# Patient Record
Sex: Female | Born: 1965 | Race: White | Hispanic: No | Marital: Married | Smoking: Never smoker
Health system: Southern US, Community
[De-identification: ages and names within clinical notes are randomized; demographics above are authoritative.]

## PROBLEM LIST (undated history)

## (undated) DIAGNOSIS — J302 Other seasonal allergic rhinitis: Secondary | ICD-10-CM

## (undated) DIAGNOSIS — K219 Gastro-esophageal reflux disease without esophagitis: Secondary | ICD-10-CM

## (undated) DIAGNOSIS — J45909 Unspecified asthma, uncomplicated: Secondary | ICD-10-CM

## (undated) DIAGNOSIS — E079 Disorder of thyroid, unspecified: Secondary | ICD-10-CM

## (undated) DIAGNOSIS — M810 Age-related osteoporosis without current pathological fracture: Secondary | ICD-10-CM

## (undated) HISTORY — DX: Unspecified asthma, uncomplicated: J45.909

## (undated) HISTORY — DX: Gastro-esophageal reflux disease without esophagitis: K21.9

## (undated) HISTORY — DX: Disorder of thyroid, unspecified: E07.9

## (undated) HISTORY — DX: Age-related osteoporosis without current pathological fracture: M81.0

## (undated) HISTORY — DX: Other seasonal allergic rhinitis: J30.2

---
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
## 1984-03-30 HISTORY — PX: WISDOM TOOTH EXTRACTION: SHX21

## 2001-08-16 ENCOUNTER — Other Ambulatory Visit: Admission: RE | Admit: 2001-08-16 | Discharge: 2001-08-16 | Payer: Self-pay | Admitting: *Deleted

## 2003-06-12 ENCOUNTER — Other Ambulatory Visit: Admission: RE | Admit: 2003-06-12 | Discharge: 2003-06-12 | Payer: Self-pay | Admitting: Obstetrics and Gynecology

## 2005-05-27 ENCOUNTER — Encounter: Admission: RE | Admit: 2005-05-27 | Discharge: 2005-05-27 | Payer: Self-pay | Admitting: Gastroenterology

## 2005-11-18 ENCOUNTER — Encounter: Admission: RE | Admit: 2005-11-18 | Discharge: 2005-11-18 | Payer: Self-pay | Admitting: Obstetrics and Gynecology

## 2007-01-26 ENCOUNTER — Encounter: Admission: RE | Admit: 2007-01-26 | Discharge: 2007-01-26 | Payer: Self-pay | Admitting: Obstetrics

## 2008-07-24 ENCOUNTER — Encounter: Admission: RE | Admit: 2008-07-24 | Discharge: 2008-07-24 | Payer: Self-pay | Admitting: Obstetrics

## 2010-04-20 ENCOUNTER — Encounter: Payer: Self-pay | Admitting: Obstetrics and Gynecology

## 2010-04-20 ENCOUNTER — Encounter: Payer: Self-pay | Admitting: Otolaryngology

## 2010-09-04 ENCOUNTER — Other Ambulatory Visit: Payer: Self-pay | Admitting: Obstetrics

## 2010-09-04 DIAGNOSIS — Z1231 Encounter for screening mammogram for malignant neoplasm of breast: Secondary | ICD-10-CM

## 2010-09-12 ENCOUNTER — Ambulatory Visit
Admission: RE | Admit: 2010-09-12 | Discharge: 2010-09-12 | Disposition: A | Discharge status: Home / Self Care | Payer: BC Managed Care – PPO | Source: Ambulatory Visit | Attending: Obstetrics | Admitting: Obstetrics

## 2010-09-12 DIAGNOSIS — Z1231 Encounter for screening mammogram for malignant neoplasm of breast: Secondary | ICD-10-CM

## 2012-10-19 ENCOUNTER — Other Ambulatory Visit: Payer: Self-pay

## 2012-10-19 DIAGNOSIS — Z1231 Encounter for screening mammogram for malignant neoplasm of breast: Secondary | ICD-10-CM

## 2012-11-09 ENCOUNTER — Ambulatory Visit
Admission: RE | Admit: 2012-11-09 | Discharge: 2012-11-09 | Disposition: A | Discharge status: Home / Self Care | Payer: BC Managed Care – PPO | Source: Ambulatory Visit

## 2012-11-09 DIAGNOSIS — Z1231 Encounter for screening mammogram for malignant neoplasm of breast: Secondary | ICD-10-CM

## 2012-11-15 DIAGNOSIS — IMO0002 Reserved for concepts with insufficient information to code with codable children: Secondary | ICD-10-CM | POA: Insufficient documentation

## 2012-11-15 DIAGNOSIS — Y9289 Other specified places as the place of occurrence of the external cause: Secondary | ICD-10-CM | POA: Insufficient documentation

## 2012-11-15 DIAGNOSIS — Y9389 Activity, other specified: Secondary | ICD-10-CM | POA: Insufficient documentation

## 2012-11-15 DIAGNOSIS — T169XXA Foreign body in ear, unspecified ear, initial encounter: Secondary | ICD-10-CM | POA: Insufficient documentation

## 2012-11-16 ENCOUNTER — Encounter (HOSPITAL_COMMUNITY): Payer: Self-pay | Admitting: Emergency Medicine

## 2012-11-16 ENCOUNTER — Emergency Department (HOSPITAL_COMMUNITY)
Admission: EM | Admit: 2012-11-16 | Discharge: 2012-11-16 | Disposition: A | Discharge status: Home / Self Care | Payer: BC Managed Care – PPO | Attending: Emergency Medicine | Admitting: Emergency Medicine

## 2012-11-16 DIAGNOSIS — S00451A Superficial foreign body of right ear, initial encounter: Secondary | ICD-10-CM

## 2012-11-16 NOTE — ED Notes (Signed)
PT. REPORTS INSECT INSIDE RIGHT EAR THIS EVENING , DENIES PAIN .

## 2012-11-16 NOTE — ED Provider Notes (Signed)
  CSN: [id number]     Arrival date & time 11/15/12  2347 History     None    Chief Complaint  Patient presents with  . Foreign Body in Ear   (Consider location/radiation/quality/duration/timing/severity/associated sxs/prior Treatment) HPI History provided by pt.   Pt was taking food outside to her cats last night, felt a buzzing in her R ear, and it felt as though a bug went into her ear canal.  Had mild discomfort and felt intermittent vibrations.  ried using a flashlight to lure the bug to the light.  She never saw the bug fly out.  Denies pain currently.  Denies hearing impairment as well.   History reviewed. No pertinent past medical history. History reviewed. No pertinent past surgical history. No family history on file. History  Substance Use Topics  . Smoking status: Never Smoker   . Smokeless tobacco: Not on file  . Alcohol Use: No   OB History   Grav Para Term Preterm Abortions TAB SAB Ect Mult Living                 Review of Systems  All other systems reviewed and are negative.    Allergies  Peanut-containing drug products; Codeine; Penicillins; Sulfa antibiotics; and Zithromax  Home Medications  No current outpatient prescriptions on file. BP 123/74  Pulse 65  Temp(Src) 97.4 F (36.3 C) (Oral)  Resp 16  SpO2 100% Physical Exam  Nursing note and vitals reviewed. Constitutional: She is oriented to person, place, and time. She appears well-developed and well-nourished. No distress.  HENT:  Head: Normocephalic and atraumatic.  R external ear and TM nml.  No foreign body, edema, bleeding or drainage in EAC.    Eyes:  Normal appearance  Neck: Normal range of motion.  Pulmonary/Chest: Effort normal.  Musculoskeletal: Normal range of motion.  Neurological: She is alert and oriented to person, place, and time.  Psychiatric: She has a normal mood and affect. Her behavior is normal.    ED Course   Procedures (including critical care time)  Labs Reviewed -  No data to display No results found. 1. Foreign body in ear lobe, right, initial encounter     MDM  Pt believes that there is an insect in her right ear.  No foreign body and R ear otherwise unremarkable on exam.  Pt reassured and d/c'd home.   Otilio Miu, PA-C 11/16/12 0301

## 2012-11-17 NOTE — ED Provider Notes (Signed)
Medical screening examination/treatment/procedure(s) were performed by non-physician practitioner and as supervising physician I was immediately available for consultation/collaboration.    Kalijah Westfall B. Draper Gallon, MD 11/17/12 1134 

## 2016-10-21 DIAGNOSIS — Z1329 Encounter for screening for other suspected endocrine disorder: Secondary | ICD-10-CM | POA: Diagnosis not present

## 2016-10-21 DIAGNOSIS — Z1231 Encounter for screening mammogram for malignant neoplasm of breast: Secondary | ICD-10-CM | POA: Diagnosis not present

## 2016-10-21 DIAGNOSIS — Z681 Body mass index (BMI) 19 or less, adult: Secondary | ICD-10-CM | POA: Diagnosis not present

## 2016-10-21 DIAGNOSIS — Z131 Encounter for screening for diabetes mellitus: Secondary | ICD-10-CM | POA: Diagnosis not present

## 2016-10-21 DIAGNOSIS — Z13220 Encounter for screening for lipoid disorders: Secondary | ICD-10-CM | POA: Diagnosis not present

## 2016-10-21 DIAGNOSIS — Z130 Encounter for screening for diseases of the blood and blood-forming organs and certain disorders involving the immune mechanism: Secondary | ICD-10-CM | POA: Diagnosis not present

## 2016-10-21 DIAGNOSIS — Z01419 Encounter for gynecological examination (general) (routine) without abnormal findings: Secondary | ICD-10-CM | POA: Diagnosis not present

## 2016-10-21 DIAGNOSIS — Z0000 Encounter for general adult medical examination without abnormal findings: Secondary | ICD-10-CM | POA: Diagnosis not present

## 2016-12-22 DIAGNOSIS — E039 Hypothyroidism, unspecified: Secondary | ICD-10-CM | POA: Diagnosis not present

## 2017-02-10 DIAGNOSIS — E039 Hypothyroidism, unspecified: Secondary | ICD-10-CM | POA: Diagnosis not present

## 2017-03-16 DIAGNOSIS — H2513 Age-related nuclear cataract, bilateral: Secondary | ICD-10-CM | POA: Diagnosis not present

## 2017-05-30 DIAGNOSIS — J309 Allergic rhinitis, unspecified: Secondary | ICD-10-CM | POA: Diagnosis not present

## 2017-05-30 DIAGNOSIS — J069 Acute upper respiratory infection, unspecified: Secondary | ICD-10-CM | POA: Diagnosis not present

## 2017-06-07 DIAGNOSIS — J0100 Acute maxillary sinusitis, unspecified: Secondary | ICD-10-CM | POA: Diagnosis not present

## 2017-06-07 DIAGNOSIS — R0789 Other chest pain: Secondary | ICD-10-CM | POA: Diagnosis not present

## 2017-08-05 DIAGNOSIS — E039 Hypothyroidism, unspecified: Secondary | ICD-10-CM | POA: Diagnosis not present

## 2017-09-20 DIAGNOSIS — L718 Other rosacea: Secondary | ICD-10-CM | POA: Diagnosis not present

## 2017-09-20 DIAGNOSIS — L821 Other seborrheic keratosis: Secondary | ICD-10-CM | POA: Diagnosis not present

## 2017-09-20 DIAGNOSIS — D0472 Carcinoma in situ of skin of left lower limb, including hip: Secondary | ICD-10-CM | POA: Diagnosis not present

## 2017-09-20 DIAGNOSIS — D1801 Hemangioma of skin and subcutaneous tissue: Secondary | ICD-10-CM | POA: Diagnosis not present

## 2017-09-20 DIAGNOSIS — D0461 Carcinoma in situ of skin of right upper limb, including shoulder: Secondary | ICD-10-CM | POA: Diagnosis not present

## 2017-10-06 DIAGNOSIS — D0461 Carcinoma in situ of skin of right upper limb, including shoulder: Secondary | ICD-10-CM | POA: Diagnosis not present

## 2017-11-03 DIAGNOSIS — E039 Hypothyroidism, unspecified: Secondary | ICD-10-CM | POA: Diagnosis not present

## 2017-11-09 DIAGNOSIS — E039 Hypothyroidism, unspecified: Secondary | ICD-10-CM | POA: Diagnosis not present

## 2017-12-09 DIAGNOSIS — Z1231 Encounter for screening mammogram for malignant neoplasm of breast: Secondary | ICD-10-CM | POA: Diagnosis not present

## 2017-12-09 DIAGNOSIS — Z01419 Encounter for gynecological examination (general) (routine) without abnormal findings: Secondary | ICD-10-CM | POA: Diagnosis not present

## 2017-12-09 DIAGNOSIS — N952 Postmenopausal atrophic vaginitis: Secondary | ICD-10-CM | POA: Diagnosis not present

## 2017-12-09 DIAGNOSIS — Z1151 Encounter for screening for human papillomavirus (HPV): Secondary | ICD-10-CM | POA: Diagnosis not present

## 2017-12-09 DIAGNOSIS — Z681 Body mass index (BMI) 19 or less, adult: Secondary | ICD-10-CM | POA: Diagnosis not present

## 2018-01-11 DIAGNOSIS — E039 Hypothyroidism, unspecified: Secondary | ICD-10-CM | POA: Diagnosis not present

## 2018-03-10 DIAGNOSIS — Z1211 Encounter for screening for malignant neoplasm of colon: Secondary | ICD-10-CM | POA: Diagnosis not present

## 2018-03-10 DIAGNOSIS — Z1212 Encounter for screening for malignant neoplasm of rectum: Secondary | ICD-10-CM | POA: Diagnosis not present

## 2018-06-14 DIAGNOSIS — E039 Hypothyroidism, unspecified: Secondary | ICD-10-CM | POA: Diagnosis not present

## 2018-11-01 DIAGNOSIS — E039 Hypothyroidism, unspecified: Secondary | ICD-10-CM | POA: Diagnosis not present

## 2018-11-15 DIAGNOSIS — E039 Hypothyroidism, unspecified: Secondary | ICD-10-CM | POA: Diagnosis not present

## 2019-04-24 DIAGNOSIS — H43812 Vitreous degeneration, left eye: Secondary | ICD-10-CM | POA: Diagnosis not present

## 2019-04-24 DIAGNOSIS — H2513 Age-related nuclear cataract, bilateral: Secondary | ICD-10-CM | POA: Diagnosis not present

## 2019-04-24 DIAGNOSIS — G43109 Migraine with aura, not intractable, without status migrainosus: Secondary | ICD-10-CM | POA: Diagnosis not present

## 2019-06-07 DIAGNOSIS — H43812 Vitreous degeneration, left eye: Secondary | ICD-10-CM | POA: Diagnosis not present

## 2019-06-07 DIAGNOSIS — G43109 Migraine with aura, not intractable, without status migrainosus: Secondary | ICD-10-CM | POA: Diagnosis not present

## 2019-06-07 DIAGNOSIS — H2513 Age-related nuclear cataract, bilateral: Secondary | ICD-10-CM | POA: Diagnosis not present

## 2019-07-20 DIAGNOSIS — Z01419 Encounter for gynecological examination (general) (routine) without abnormal findings: Secondary | ICD-10-CM | POA: Diagnosis not present

## 2019-07-20 DIAGNOSIS — R7989 Other specified abnormal findings of blood chemistry: Secondary | ICD-10-CM | POA: Diagnosis not present

## 2019-07-20 DIAGNOSIS — Z13220 Encounter for screening for lipoid disorders: Secondary | ICD-10-CM | POA: Diagnosis not present

## 2019-07-20 DIAGNOSIS — Z0000 Encounter for general adult medical examination without abnormal findings: Secondary | ICD-10-CM | POA: Diagnosis not present

## 2019-07-20 DIAGNOSIS — Z131 Encounter for screening for diabetes mellitus: Secondary | ICD-10-CM | POA: Diagnosis not present

## 2019-07-20 DIAGNOSIS — Z1231 Encounter for screening mammogram for malignant neoplasm of breast: Secondary | ICD-10-CM | POA: Diagnosis not present

## 2019-07-20 DIAGNOSIS — Z130 Encounter for screening for diseases of the blood and blood-forming organs and certain disorders involving the immune mechanism: Secondary | ICD-10-CM | POA: Diagnosis not present

## 2019-07-20 DIAGNOSIS — Z6822 Body mass index (BMI) 22.0-22.9, adult: Secondary | ICD-10-CM | POA: Diagnosis not present

## 2019-11-14 DIAGNOSIS — E039 Hypothyroidism, unspecified: Secondary | ICD-10-CM | POA: Diagnosis not present

## 2019-11-21 DIAGNOSIS — E039 Hypothyroidism, unspecified: Secondary | ICD-10-CM | POA: Diagnosis not present

## 2020-09-26 DIAGNOSIS — M9903 Segmental and somatic dysfunction of lumbar region: Secondary | ICD-10-CM | POA: Diagnosis not present

## 2020-09-26 DIAGNOSIS — M5116 Intervertebral disc disorders with radiculopathy, lumbar region: Secondary | ICD-10-CM | POA: Diagnosis not present

## 2020-09-26 DIAGNOSIS — M25551 Pain in right hip: Secondary | ICD-10-CM | POA: Diagnosis not present

## 2020-09-26 DIAGNOSIS — M9905 Segmental and somatic dysfunction of pelvic region: Secondary | ICD-10-CM | POA: Diagnosis not present

## 2020-10-28 DIAGNOSIS — E039 Hypothyroidism, unspecified: Secondary | ICD-10-CM | POA: Diagnosis not present

## 2020-11-01 DIAGNOSIS — E039 Hypothyroidism, unspecified: Secondary | ICD-10-CM | POA: Diagnosis not present

## 2021-02-26 DIAGNOSIS — E039 Hypothyroidism, unspecified: Secondary | ICD-10-CM | POA: Diagnosis not present

## 2021-03-26 DIAGNOSIS — K900 Celiac disease: Secondary | ICD-10-CM | POA: Diagnosis not present

## 2021-03-26 DIAGNOSIS — E039 Hypothyroidism, unspecified: Secondary | ICD-10-CM | POA: Diagnosis not present

## 2021-03-26 DIAGNOSIS — E063 Autoimmune thyroiditis: Secondary | ICD-10-CM | POA: Diagnosis not present

## 2021-03-26 DIAGNOSIS — R413 Other amnesia: Secondary | ICD-10-CM | POA: Diagnosis not present

## 2021-03-30 HISTORY — PX: MELANOMA EXCISION: SHX5266

## 2021-04-03 DIAGNOSIS — D0371 Melanoma in situ of right lower limb, including hip: Secondary | ICD-10-CM | POA: Diagnosis not present

## 2021-04-03 DIAGNOSIS — D225 Melanocytic nevi of trunk: Secondary | ICD-10-CM | POA: Diagnosis not present

## 2021-04-03 DIAGNOSIS — L820 Inflamed seborrheic keratosis: Secondary | ICD-10-CM | POA: Diagnosis not present

## 2021-04-03 DIAGNOSIS — L821 Other seborrheic keratosis: Secondary | ICD-10-CM | POA: Diagnosis not present

## 2021-04-03 DIAGNOSIS — D485 Neoplasm of uncertain behavior of skin: Secondary | ICD-10-CM | POA: Diagnosis not present

## 2021-04-24 DIAGNOSIS — L988 Other specified disorders of the skin and subcutaneous tissue: Secondary | ICD-10-CM | POA: Diagnosis not present

## 2021-04-24 DIAGNOSIS — D0371 Melanoma in situ of right lower limb, including hip: Secondary | ICD-10-CM | POA: Diagnosis not present

## 2021-06-04 DIAGNOSIS — R413 Other amnesia: Secondary | ICD-10-CM | POA: Diagnosis not present

## 2021-06-04 DIAGNOSIS — E039 Hypothyroidism, unspecified: Secondary | ICD-10-CM | POA: Diagnosis not present

## 2021-06-04 DIAGNOSIS — K900 Celiac disease: Secondary | ICD-10-CM | POA: Diagnosis not present

## 2021-06-17 DIAGNOSIS — K9041 Non-celiac gluten sensitivity: Secondary | ICD-10-CM | POA: Diagnosis not present

## 2021-06-17 DIAGNOSIS — L900 Lichen sclerosus et atrophicus: Secondary | ICD-10-CM | POA: Diagnosis not present

## 2021-06-17 DIAGNOSIS — Z1231 Encounter for screening mammogram for malignant neoplasm of breast: Secondary | ICD-10-CM | POA: Diagnosis not present

## 2021-06-17 DIAGNOSIS — Z113 Encounter for screening for infections with a predominantly sexual mode of transmission: Secondary | ICD-10-CM | POA: Diagnosis not present

## 2021-06-17 DIAGNOSIS — Z0000 Encounter for general adult medical examination without abnormal findings: Secondary | ICD-10-CM | POA: Diagnosis not present

## 2021-06-17 DIAGNOSIS — Z01419 Encounter for gynecological examination (general) (routine) without abnormal findings: Secondary | ICD-10-CM | POA: Diagnosis not present

## 2021-06-17 DIAGNOSIS — Z13220 Encounter for screening for lipoid disorders: Secondary | ICD-10-CM | POA: Diagnosis not present

## 2021-06-17 DIAGNOSIS — Z124 Encounter for screening for malignant neoplasm of cervix: Secondary | ICD-10-CM | POA: Diagnosis not present

## 2021-06-17 DIAGNOSIS — Z681 Body mass index (BMI) 19 or less, adult: Secondary | ICD-10-CM | POA: Diagnosis not present

## 2021-06-17 DIAGNOSIS — Z131 Encounter for screening for diabetes mellitus: Secondary | ICD-10-CM | POA: Diagnosis not present

## 2021-06-18 ENCOUNTER — Other Ambulatory Visit: Payer: Self-pay | Admitting: Obstetrics

## 2021-06-18 DIAGNOSIS — Z13820 Encounter for screening for osteoporosis: Secondary | ICD-10-CM

## 2021-09-26 DIAGNOSIS — M5451 Vertebrogenic low back pain: Secondary | ICD-10-CM | POA: Diagnosis not present

## 2021-10-08 DIAGNOSIS — D225 Melanocytic nevi of trunk: Secondary | ICD-10-CM | POA: Diagnosis not present

## 2021-10-08 DIAGNOSIS — L819 Disorder of pigmentation, unspecified: Secondary | ICD-10-CM | POA: Diagnosis not present

## 2021-10-08 DIAGNOSIS — D2261 Melanocytic nevi of right upper limb, including shoulder: Secondary | ICD-10-CM | POA: Diagnosis not present

## 2021-10-08 DIAGNOSIS — D2262 Melanocytic nevi of left upper limb, including shoulder: Secondary | ICD-10-CM | POA: Diagnosis not present

## 2021-10-10 DIAGNOSIS — M5451 Vertebrogenic low back pain: Secondary | ICD-10-CM | POA: Diagnosis not present

## 2021-10-24 DIAGNOSIS — M5451 Vertebrogenic low back pain: Secondary | ICD-10-CM | POA: Diagnosis not present

## 2021-10-31 DIAGNOSIS — M5451 Vertebrogenic low back pain: Secondary | ICD-10-CM | POA: Diagnosis not present

## 2021-11-06 DIAGNOSIS — M5451 Vertebrogenic low back pain: Secondary | ICD-10-CM | POA: Diagnosis not present

## 2021-12-24 ENCOUNTER — Ambulatory Visit
Admission: RE | Admit: 2021-12-24 | Discharge: 2021-12-24 | Disposition: A | Discharge status: Home / Self Care | Payer: Self-pay | Source: Ambulatory Visit | Attending: Obstetrics | Admitting: Obstetrics

## 2021-12-24 DIAGNOSIS — Z13820 Encounter for screening for osteoporosis: Secondary | ICD-10-CM

## 2021-12-24 DIAGNOSIS — Z780 Asymptomatic menopausal state: Secondary | ICD-10-CM | POA: Diagnosis not present

## 2021-12-24 DIAGNOSIS — M8588 Other specified disorders of bone density and structure, other site: Secondary | ICD-10-CM | POA: Diagnosis not present

## 2021-12-24 DIAGNOSIS — M810 Age-related osteoporosis without current pathological fracture: Secondary | ICD-10-CM | POA: Diagnosis not present

## 2022-01-09 ENCOUNTER — Other Ambulatory Visit: Payer: Self-pay | Admitting: Obstetrics

## 2022-01-09 DIAGNOSIS — Z8249 Family history of ischemic heart disease and other diseases of the circulatory system: Secondary | ICD-10-CM | POA: Diagnosis not present

## 2022-01-09 DIAGNOSIS — M810 Age-related osteoporosis without current pathological fracture: Secondary | ICD-10-CM | POA: Diagnosis not present

## 2022-01-28 ENCOUNTER — Encounter: Payer: Self-pay | Admitting: Internal Medicine

## 2022-01-28 ENCOUNTER — Ambulatory Visit: Payer: BC Managed Care – PPO | Admitting: Internal Medicine

## 2022-01-28 VITALS — BP 125/65 | HR 64 | Ht 65.0 in | Wt 114.8 lb

## 2022-01-28 DIAGNOSIS — M810 Age-related osteoporosis without current pathological fracture: Secondary | ICD-10-CM | POA: Diagnosis not present

## 2022-01-28 DIAGNOSIS — Z8249 Family history of ischemic heart disease and other diseases of the circulatory system: Secondary | ICD-10-CM | POA: Diagnosis not present

## 2022-01-28 DIAGNOSIS — E038 Other specified hypothyroidism: Secondary | ICD-10-CM | POA: Insufficient documentation

## 2022-01-28 NOTE — Progress Notes (Signed)
Primary Physician/Referring:  Aloha Gell, MD  Patient ID: Barbara Mcknight, female    DOB: May 20, 1965, 56 y.o.   MRN: [id number]  Chief Complaint  Patient presents with   Family Hx of CAD   New Patient (Initial Visit)    Referred by Dr. Aloha Gell   HPI:    Barbara Mcknight  is a 56 y.o. female with past medical history significant for family history of early heart disease, hypothyroidism, and osteoporosis who is here to establish care with cardiology. Her brother died suddenly from a massive MI when he was 57 years old and patient is nervous she may have disease too. Patient does not smoke or drink alcohol. She was told after she gave birth that she has MVP but was later told that it resolved on its own. Otherwise she has not been told she has cardiac disease. She has had a few episodes of random palpitations that can come on at any time, however, this is likely medication side effects as patient states her body is highly sensitive to most drugs. Denies chest pain, shortness of breath, palpitations, diaphoresis, syncope, edema, PND, orthopnea.   Past Medical History:  Diagnosis Date   Thyroid disease    Past Surgical History:  Procedure Laterality Date   MELANOMA EXCISION Right 03/2021   ankle   TONSILLECTOMY  1970   Family History  Problem Relation Age of Onset   Hyperlipidemia Father    Sudden Cardiac Death Brother     Social History   Tobacco Use   Smoking status: Never   Smokeless tobacco: Not on file  Substance Use Topics   Alcohol use: No   Marital Status: Married  ROS  Review of Systems  Cardiovascular:  Positive for irregular heartbeat and palpitations.   Objective  Blood pressure 125/65, pulse 64, height _0  (1.651 m), weight 114 lb 12.8 oz (52.1 kg), SpO2 98 %. Body mass index is 19.1 kg/m.     01/28/2022   12:55 PM 11/16/2012    2:29 AM 11/15/2012   11:58 PM  Vitals with BMI  Height _1     Weight 114 lbs 13 oz    BMI 16.0    Systolic 737  106 98  Diastolic 65 74 77  Pulse 64 65 70     Physical Exam Vitals reviewed.  HENT:     Head: Normocephalic and atraumatic.  Cardiovascular:     Rate and Rhythm: Normal rate and regular rhythm.     Pulses: Normal pulses.     Heart sounds: Normal heart sounds. No murmur heard. Pulmonary:     Effort: Pulmonary effort is normal.     Breath sounds: Normal breath sounds.  Abdominal:     General: Bowel sounds are normal.  Musculoskeletal:     Right lower leg: No edema.     Left lower leg: No edema.  Skin:    General: Skin is warm and dry.  Neurological:     Mental Status: She is alert.     Medications and allergies   Allergies  Allergen Reactions   Cat Hair Extract     Other reaction(s): eye redness, other   Cefdinir Nausea And Vomiting    Other reaction(s): abdominal pain   Levothyroxine Sodium     Other reaction(s): muscle cramps, Not available, other   Peanut-Containing Drug Products Anaphylaxis    All nuts    Codeine Nausea And Vomiting   Misc. Sulfonamide Containing Compounds     Other reaction(s):  Not available   Penicillins Hives   Sulfa Antibiotics Nausea And Vomiting   Zithromax [Azithromycin] Nausea And Vomiting     Medication list after today's encounter   Current Outpatient Medications:    alendronate (FOSAMAX) 70 MG tablet, Take 70 mg by mouth once a week., Disp: , Rfl:    levocetirizine (XYZAL) 5 MG tablet, Take 5 mg by mouth every evening., Disp: , Rfl:    Levothyroxine Sodium (TIROSINT-SOL) 50 MCG/ML SOLN, Take 1 mL by mouth daily., Disp: , Rfl:   Laboratory examination:   No results found for: "NA", "K", "CO2", "GLUCOSE", "BUN", "CREATININE", "CALCIUM", "EGFR", "GFRNONAA"      No data to display             No data to display          Lipid Panel No results for input(s): "CHOL", "TRIG", "LDLCALC", "VLDL", "HDL", "CHOLHDL", "LDLDIRECT" in the last 8760 hours.  HEMOGLOBIN A1C No results found for: "HGBA1C", "MPG" TSH No results  for input(s): "TSH" in the last 8760 hours.  External labs:     Radiology:    Cardiac Studies:   No results found for this or any previous visit from the past 1095 days.     No results found for this or any previous visit from the past 1095 days.     EKG:   01/28/2022: NSR, iRBBB, normal axis, no evidence of ischemia  Assessment     ICD-10-CM   1. Family history of early CAD  Z82.49 EKG 12-Lead    CT CORONARY MORPH W/CTA COR W/SCORE W/CA W/CM &/OR WO/CM    2. Osteoporosis not affecting current episode of care  M81.0     3. Other specified hypothyroidism  E03.8        Orders Placed This Encounter  Procedures   CT CORONARY MORPH W/CTA COR W/SCORE W/CA W/CM &/OR WO/CM    Standing Status:   Future    Standing Expiration Date:   04/30/2022    Order Specific Question:   If indicated for the ordered procedure, I authorize the administration of contrast media per Radiology protocol    Answer:   Yes    Order Specific Question:   Is patient pregnant?    Answer:   No    Order Specific Question:   Preferred Imaging Location?    Answer:   [facility]   EKG 12-Lead    No orders of the defined types were placed in this encounter.   There are no discontinued medications.   Recommendations:   Barbara Mcknight is a 56 y.o.  female with family history of early heart disease - brother passed at age 62 from massive MI   Family history of early CAD Will send for coronary CTA with [facility]   Osteoporosis not affecting current episode of care PCP following, on Fosamax   Other specified hypothyroidism PCP following, on tirosint     [name], DO, [facility]  01/28/2022, 1:37 PM Office: [phone] Pager: [phone]

## 2022-02-04 ENCOUNTER — Encounter: Payer: Self-pay | Admitting: Gastroenterology

## 2022-02-11 ENCOUNTER — Ambulatory Visit (AMBULATORY_SURGERY_CENTER): Payer: Self-pay

## 2022-02-11 VITALS — Ht 65.0 in | Wt 113.0 lb

## 2022-02-11 DIAGNOSIS — Z1211 Encounter for screening for malignant neoplasm of colon: Secondary | ICD-10-CM

## 2022-02-11 MED ORDER — NA SULFATE-K SULFATE-MG SULF 17.5-3.13-1.6 GM/177ML PO SOLN: 1.0000 | Freq: Once | ORAL | 0 refills | Status: AC

## 2022-02-11 NOTE — Progress Notes (Signed)
No egg or soy allergy known to patient;  No issues known to pt with past sedation with any surgeries or procedures; Patient denies ever being told they had issues or difficulty with intubation;  No FH of Malignant Hyperthermia; Pt is not on diet pills; Pt is not on home 02;  Pt is not on blood thinners;  Pt denies issues with constipation;  No A fib or A flutter;  Have any cardiac testing pending--YES- CT cardiac scoring and CT coronary morph with CTA has been ordered due to family history of early cardiac death; patient requested to be seen by cardiology, no cardiac symptoms, wants to "be seen for my peace of mind";   Pt instructed to use Singlecare.com or GoodRx for a price reduction on prep;  GoodRx coupon for [facility] given to patient during [facility] appt;   Insurance verified during [place] appt=BCBS PPO  Patient's chart reviewed by Barbara Mcknight CNRA prior to previsit and patient appropriate for the [place].  Previsit completed and red dot placed by patient's name on their procedure day (on provider's schedule).

## 2022-02-27 ENCOUNTER — Encounter: Payer: Self-pay | Admitting: Gastroenterology

## 2022-03-04 DIAGNOSIS — E039 Hypothyroidism, unspecified: Secondary | ICD-10-CM | POA: Diagnosis not present

## 2022-03-06 ENCOUNTER — Ambulatory Visit
Admission: RE | Admit: 2022-03-06 | Discharge: 2022-03-06 | Disposition: A | Discharge status: Home / Self Care | Payer: No Typology Code available for payment source | Source: Ambulatory Visit | Attending: Obstetrics | Admitting: Obstetrics

## 2022-03-06 DIAGNOSIS — Z8249 Family history of ischemic heart disease and other diseases of the circulatory system: Secondary | ICD-10-CM | POA: Diagnosis not present

## 2022-03-11 ENCOUNTER — Ambulatory Visit: Payer: BC Managed Care – PPO | Admitting: Internal Medicine

## 2022-03-11 DIAGNOSIS — E039 Hypothyroidism, unspecified: Secondary | ICD-10-CM | POA: Diagnosis not present

## 2022-03-11 DIAGNOSIS — R413 Other amnesia: Secondary | ICD-10-CM | POA: Diagnosis not present

## 2022-03-11 DIAGNOSIS — K900 Celiac disease: Secondary | ICD-10-CM | POA: Diagnosis not present

## 2022-03-11 DIAGNOSIS — E063 Autoimmune thyroiditis: Secondary | ICD-10-CM | POA: Diagnosis not present

## 2022-03-13 ENCOUNTER — Ambulatory Visit: Payer: BC Managed Care – PPO | Admitting: Internal Medicine

## 2022-03-25 ENCOUNTER — Encounter: Payer: Self-pay | Admitting: Internal Medicine

## 2022-03-25 ENCOUNTER — Ambulatory Visit: Payer: BC Managed Care – PPO | Admitting: Internal Medicine

## 2022-03-25 VITALS — BP 108/59 | HR 68 | Ht 65.0 in | Wt 113.8 lb

## 2022-03-25 DIAGNOSIS — Z8249 Family history of ischemic heart disease and other diseases of the circulatory system: Secondary | ICD-10-CM | POA: Diagnosis not present

## 2022-03-25 DIAGNOSIS — E038 Other specified hypothyroidism: Secondary | ICD-10-CM | POA: Diagnosis not present

## 2022-03-25 DIAGNOSIS — M810 Age-related osteoporosis without current pathological fracture: Secondary | ICD-10-CM | POA: Diagnosis not present

## 2022-03-25 NOTE — Progress Notes (Signed)
Primary Physician/Referring:  Aloha Gell, MD  Patient ID: Barbara Mcknight, female    DOB: [birth year], 56 y.o.   MRN: [id number]  Chief Complaint  Patient presents with   Family history of early CAD   Follow-up   HPI:    Barbara Mcknight  is a 56 y.o. female with past medical history significant for family history of early heart disease, hypothyroidism, and osteoporosis who is here for follow-up visit. She has been doing well since the last time she was here. Her coronary calcium score was zero which places her at very low risk for heart disease so patient does feel better having these news. Otherwise, she has recently been diagnosed with osteoporosis and started a supplement for that. Denies chest pain, shortness of breath, palpitations, diaphoresis, syncope, edema, PND, orthopnea.   Past Medical History:  Diagnosis Date   Asthma    childhood   GERD (gastroesophageal reflux disease)    OTC PRN meds-with certain foods   Osteoporosis    on meds   Seasonal allergies    Thyroid disease    Past Surgical History:  Procedure Laterality Date   MELANOMA EXCISION Right 03/2021   ankle   TONSILLECTOMY  1970   WISDOM TOOTH EXTRACTION  1986   Family History  Problem Relation Age of Onset   Colon polyps Mother 16   Colon polyps Father 36   Hyperlipidemia Father    Sudden Cardiac Death Brother    Colon cancer Neg Hx    Esophageal cancer Neg Hx    Stomach cancer Neg Hx    Rectal cancer Neg Hx     Social History   Tobacco Use   Smoking status: Never   Smokeless tobacco: Never  Substance Use Topics   Alcohol use: No   Marital Status: Married  ROS  Review of Systems  Cardiovascular:  Positive for irregular heartbeat and palpitations.   Objective  Blood pressure (!) 108/59, pulse 68, height _0  (1.651 m), weight 113 lb 12.8 oz (51.6 kg), SpO2 98 %. Body mass index is 18.94 kg/m.     03/25/2022    1:03 PM 02/11/2022    1:33 PM 01/28/2022   12:55 PM  Vitals with BMI   Height _1  _2  _3   Weight 113 lbs 13 oz 113 lbs 114 lbs 13 oz  BMI 18.94 22.2 97.9  Systolic 892  119  Diastolic 59  65  Pulse 68  64     Physical Exam Vitals reviewed.  HENT:     Head: Normocephalic and atraumatic.  Cardiovascular:     Rate and Rhythm: Normal rate and regular rhythm.     Pulses: Normal pulses.     Heart sounds: Normal heart sounds. No murmur heard. Pulmonary:     Effort: Pulmonary effort is normal.     Breath sounds: Normal breath sounds.  Abdominal:     General: Bowel sounds are normal.  Musculoskeletal:     Right lower leg: No edema.     Left lower leg: No edema.  Skin:    General: Skin is warm and dry.  Neurological:     Mental Status: She is alert.     Medications and allergies   Allergies  Allergen Reactions   Cat Hair Extract     Other reaction(s): eye redness, other   Cefdinir Nausea And Vomiting    Other reaction(s): abdominal pain   Levothyroxine Sodium     Other reaction(s): muscle cramps, Not  available, other   Peanut-Containing Drug Products Anaphylaxis    All nuts    Codeine Nausea And Vomiting   Misc. Sulfonamide Containing Compounds     Other reaction(s): Not available   Penicillins Hives   Sulfa Antibiotics Nausea And Vomiting   Zithromax [Azithromycin] Nausea And Vomiting     Medication list after today's encounter   Current Outpatient Medications:    levocetirizine (XYZAL) 5 MG tablet, Take 5 mg by mouth every evening., Disp: , Rfl:    Levothyroxine Sodium (TIROSINT-SOL) 50 MCG/ML SOLN, Take 1 mL by mouth daily., Disp: , Rfl:    alendronate (FOSAMAX) 70 MG tablet, Take 70 mg by mouth once a week. (Patient not taking: Reported on 02/11/2022), Disp: , Rfl:   Laboratory examination:   No results found for: "NA", "K", "CO2", "GLUCOSE", "BUN", "CREATININE", "CALCIUM", "EGFR", "GFRNONAA"      No data to display             No data to display          Lipid Panel No results for input(s): "CHOL", "TRIG",  "[place]", "VLDL", "HDL", "CHOLHDL", "LDLDIRECT" in the last 8760 hours.  HEMOGLOBIN A1C No results found for: "HGBA1C", "MPG" TSH No results for input(s): "TSH" in the last 8760 hours.  External labs:     Radiology:    Cardiac Studies:   03/06/2022 FINDINGS: CORONARY CALCIUM SCORES:   Left Main: 0   LAD: 0   LCx: 0   RCA: 0   Total Agatston Score: 0   MESA database percentile: 0    EKG:   01/28/2022: NSR, iRBBB, normal axis, no evidence of ischemia  Assessment     ICD-10-CM   1. Family history of early CAD  Z82.49        No orders of the defined types were placed in this encounter.   No orders of the defined types were placed in this encounter.   There are no discontinued medications.   Recommendations:   Barbara Mcknight is a 56 y.o.  female with family history of early heart disease - brother passed at age 79 from massive MI   Family history of early CAD Coronary calcium score was 0 Patient is very low risk for CAD  Follow-up in 12 months or sooner if needed     [name], DO, [facility]  03/25/2022, 1:17 PM Office: [phone] Pager: [phone]

## 2022-03-26 DIAGNOSIS — L905 Scar conditions and fibrosis of skin: Secondary | ICD-10-CM | POA: Diagnosis not present

## 2022-03-26 DIAGNOSIS — L819 Disorder of pigmentation, unspecified: Secondary | ICD-10-CM | POA: Diagnosis not present

## 2022-03-26 DIAGNOSIS — D2261 Melanocytic nevi of right upper limb, including shoulder: Secondary | ICD-10-CM | POA: Diagnosis not present

## 2022-03-26 DIAGNOSIS — D225 Melanocytic nevi of trunk: Secondary | ICD-10-CM | POA: Diagnosis not present

## 2022-04-08 ENCOUNTER — Encounter: Payer: Self-pay | Admitting: Internal Medicine

## 2022-04-10 ENCOUNTER — Ambulatory Visit: Payer: BC Managed Care – PPO | Admitting: Internal Medicine

## 2022-04-10 ENCOUNTER — Encounter: Payer: Self-pay | Admitting: Internal Medicine

## 2022-04-10 VITALS — BP 123/68 | HR 71 | Temp 99.3°F | Resp 12 | Ht 65.0 in | Wt 113.0 lb

## 2022-04-10 DIAGNOSIS — D122 Benign neoplasm of ascending colon: Secondary | ICD-10-CM

## 2022-04-10 DIAGNOSIS — Z1211 Encounter for screening for malignant neoplasm of colon: Secondary | ICD-10-CM | POA: Diagnosis not present

## 2022-04-10 MED ORDER — SODIUM CHLORIDE 0.9 % IV SOLN: 500.0000 mL | Freq: Once | INTRAVENOUS | Status: DC

## 2022-04-10 NOTE — Progress Notes (Unsigned)
GASTROENTEROLOGY PROCEDURE H&P NOTE   Primary Care Physician: Aloha Gell, MD    Reason for Procedure:   Colon cancer screening  Plan:    Colonoscopy  Patient is appropriate for endoscopic procedure(s) in the ambulatory ([place]) setting.  The nature of the procedure, as well as the risks, benefits, and alternatives were carefully and thoroughly reviewed with the patient. Ample time for discussion and questions allowed. The patient understood, was satisfied, and agreed to proceed.     HPI: Barbara Mcknight is a 57 y.o. female who presents for colonoscopy for colon cancer screening. Denies changes in bowel habits or unintentional weight loss. She has occasionally seen scant amounts of rectal bleeding due to hemorrhoids over the last year. Her great uncle has colon cancer.  Past Medical History:  Diagnosis Date   Asthma    childhood   GERD (gastroesophageal reflux disease)    OTC PRN meds-with certain foods   Osteoporosis    on meds   Seasonal allergies    Thyroid disease     Past Surgical History:  Procedure Laterality Date   MELANOMA EXCISION Right 03/2021   ankle   TONSILLECTOMY  1970   WISDOM TOOTH EXTRACTION  1986    Prior to Admission medications   Medication Sig Start Date End Date Taking? Authorizing Provider  levocetirizine (XYZAL) 5 MG tablet Take 5 mg by mouth every evening.   Yes [provider]  Levothyroxine Sodium (TIROSINT-SOL) 50 MCG/ML SOLN Take 1 mL by mouth daily.   Yes [provider]  alendronate (FOSAMAX) 70 MG tablet Take 70 mg by mouth once a week. Patient not taking: Reported on 02/11/2022 01/09/22   [provider]    Current Outpatient Medications  Medication Sig Dispense Refill   levocetirizine (XYZAL) 5 MG tablet Take 5 mg by mouth every evening.     Levothyroxine Sodium (TIROSINT-SOL) 50 MCG/ML SOLN Take 1 mL by mouth daily.     alendronate (FOSAMAX) 70 MG tablet Take 70 mg by mouth once a week. (Patient  not taking: Reported on 02/11/2022)     Current Facility-Administered Medications  Medication Dose Route Frequency Provider Last Rate Last Admin   0.9 %  sodium chloride infusion  500 mL Intravenous Once Sharyn Creamer, MD        Allergies as of 04/10/2022 - Review Complete 04/10/2022  Allergen Reaction Noted   Cat hair extract  01/28/2022   Cefdinir Nausea And Vomiting 01/28/2022   Levothyroxine sodium  01/28/2022   Peanut-containing drug products Anaphylaxis 11/16/2012   Codeine Nausea And Vomiting 11/16/2012   Misc. sulfonamide containing compounds  01/28/2022   Penicillins Hives 11/16/2012   Sulfa antibiotics Nausea And Vomiting 11/16/2012   Zithromax [azithromycin] Nausea And Vomiting 11/16/2012    Family History  Problem Relation Age of Onset   Colon polyps Mother 91   Colon polyps Father 47   Hyperlipidemia Father    Sudden Cardiac Death Brother    Colon cancer Neg Hx    Esophageal cancer Neg Hx    Stomach cancer Neg Hx    Rectal cancer Neg Hx     Social History   Socioeconomic History   Marital status: Married    Spouse name: Not on file   Number of children: Not on file   Years of education: Not on file   Highest education level: Not on file  Occupational History   Not on file  Tobacco Use   Smoking status: Never   Smokeless  tobacco: Never  Vaping Use   Vaping Use: Never used  Substance and Sexual Activity   Alcohol use: No   Drug use: No   Sexual activity: Not on file  Other Topics Concern   Not on file  Social History Narrative   Not on file   Social Determinants of Health   Financial Resource Strain: Not on file  Food Insecurity: Not on file  Transportation Needs: Not on file  Physical Activity: Not on file  Stress: Not on file  Social Connections: Not on file  Intimate Partner Violence: Not on file    Physical Exam: Vital signs in last 24 hours: BP 121/71   Pulse 87   Temp 99.3 F (37.4 C)   Ht '5\' 5"'$  (1.651 m)   Wt 113 lb (51.3  kg)   SpO2 100%   BMI 18.80 kg/m  GEN: NAD EYE: Sclerae anicteric ENT: MMM CV: Non-tachycardic Pulm: No increased work of breathing GI: Soft, NT/ND NEURO:  Alert & Oriented   Christia Reading, MD [facility]  04/10/2022 2:20 PM

## 2022-04-10 NOTE — Progress Notes (Unsigned)
Called to room to assist during endoscopic procedure.  Patient ID and intended procedure confirmed with present staff. Received instructions for my participation in the procedure from the performing physician.

## 2022-04-10 NOTE — Progress Notes (Unsigned)
Pt's states no medical or surgical changes since previsit or office visit. 

## 2022-04-10 NOTE — Patient Instructions (Signed)
Handout on polyps and diverticulosis given.    YOU HAD AN ENDOSCOPIC PROCEDURE TODAY AT THE [facility]:   Refer to the procedure report that was given to you for any specific questions about what was found during the examination.  If the procedure report does not answer your questions, please call your gastroenterologist to clarify.  If you requested that your care partner not be given the details of your procedure findings, then the procedure report has been included in a sealed envelope for you to review at your convenience later.  YOU SHOULD EXPECT: Some feelings of bloating in the abdomen. Passage of more gas than usual.  Walking can help get rid of the air that was put into your GI tract during the procedure and reduce the bloating. If you had a lower endoscopy (such as a colonoscopy or flexible sigmoidoscopy) you may notice spotting of blood in your stool or on the toilet paper. If you underwent a bowel prep for your procedure, you may not have a normal bowel movement for a few days.  Please Note:  You might notice some irritation and congestion in your nose or some drainage.  This is from the oxygen used during your procedure.  There is no need for concern and it should clear up in a day or so.  SYMPTOMS TO REPORT IMMEDIATELY:  Following lower endoscopy (colonoscopy or flexible sigmoidoscopy):  Excessive amounts of blood in the stool  Significant tenderness or worsening of abdominal pains  Swelling of the abdomen that is new, acute  Fever of 100F or higher   For urgent or emergent issues, a gastroenterologist can be reached at any hour by calling [phone]. Do not use MyChart messaging for urgent concerns.    DIET:  We do recommend a small meal at first, but then you may proceed to your regular diet.  Drink plenty of fluids but you should avoid alcoholic beverages for 24 hours.  ACTIVITY:  You should plan to take it easy for the rest of today and you should NOT  DRIVE or use heavy machinery until tomorrow (because of the sedation medicines used during the test).    FOLLOW UP: Our staff will call the number listed on your records the next business day following your procedure.  We will call around 7:15- 8:00 am to check on you and address any questions or concerns that you may have regarding the information given to you following your procedure. If we do not reach you, we will leave a message.     If any biopsies were taken you will be contacted by phone or by letter within the next 1-3 weeks.  Please call us at [phone] if you have not heard about the biopsies in 3 weeks.    SIGNATURES/CONFIDENTIALITY: You and/or your care partner have signed paperwork which will be entered into your electronic medical record.  These signatures attest to the fact that that the information above on your After Visit Summary has been reviewed and is understood.  Full responsibility of the confidentiality of this discharge information lies with you and/or your care-partner. 

## 2022-04-10 NOTE — Progress Notes (Unsigned)
A and O x3. Report to RN. Tolerated MAC anesthesia well. 

## 2022-04-10 NOTE — Op Note (Signed)
[place] Patient Name: Barbara Mcknight Procedure Date: 04/10/2022 2:59 PM MRN: [id number] Endoscopist: Georgian Co , , [id number] Age: 57 Referring MD:  Date of Birth: 18-Nov-1965 Gender: Female Account #: [id number] Procedure:                Colonoscopy Indications:              Screening for colorectal malignant neoplasm, This                            is the patient's first colonoscopy Medicines:                Monitored Anesthesia Care Procedure:                Pre-Anesthesia Assessment:                           - Prior to the procedure, a History and Physical                            was performed, and patient medications and                            allergies were reviewed. The patient's tolerance of                            previous anesthesia was also reviewed. The risks                            and benefits of the procedure and the sedation                            options and risks were discussed with the patient.                            All questions were answered, and informed consent                            was obtained. Prior Anticoagulants: The patient has                            taken no anticoagulant or antiplatelet agents. ASA                            Grade Assessment: II - A patient with mild systemic                            disease. After reviewing the risks and benefits,                            the patient was deemed in satisfactory condition to                            undergo the procedure.  After obtaining informed consent, the colonoscope                            was passed under direct vision. Throughout the                            procedure, the patient's blood pressure, pulse, and                            oxygen saturations were monitored continuously. The                            Olympus PCF-H190DL (#[id number]) Colonoscope was                            introduced through  the anus and advanced to the the                            terminal ileum. The colonoscopy was performed                            without difficulty. The patient tolerated the                            procedure well. The quality of the bowel                            preparation was good. The terminal ileum, ileocecal                            valve, appendiceal orifice, and rectum were                            photographed. Scope In: 3:03:14 PM Scope Out: 3:23:10 PM Scope Withdrawal Time: 0 hours 10 minutes 1 second  Total Procedure Duration: 0 hours 19 minutes 56 seconds  Findings:                 The terminal ileum appeared normal.                           A 5 mm polyp was found in the ascending colon. The                            polyp was sessile. The polyp was removed with a                            cold snare. Resection and retrieval were complete.                           A few diverticula were found in the sigmoid colon.                           Non-bleeding internal hemorrhoids were found during  retroflexion. Complications:            No immediate complications. Estimated Blood Loss:     Estimated blood loss was minimal. Impression:               - The examined portion of the ileum was normal.                           - One 5 mm polyp in the ascending colon, removed                            with a cold snare. Resected and retrieved.                           - Diverticulosis in the sigmoid colon.                           - Non-bleeding internal hemorrhoids. Recommendation:           - Discharge patient to home (with escort).                           - Await pathology results.                           - The findings and recommendations were discussed                            with the patient. Dr Georgian Co "Lyndee Leo" Lorenso Courier,  04/10/2022 3:26:51 PM

## 2022-04-13 ENCOUNTER — Telehealth: Payer: Self-pay | Admitting: *Deleted

## 2022-04-13 NOTE — Telephone Encounter (Signed)
Post procedure follow up call placed, no answer and left VM.  

## 2022-04-16 ENCOUNTER — Encounter: Payer: Self-pay | Admitting: Internal Medicine

## 2022-04-30 ENCOUNTER — Telehealth: Payer: Self-pay | Admitting: Internal Medicine

## 2022-04-30 NOTE — Telephone Encounter (Signed)
Inbound call from patient requesting results from procedure on 1/12. Please advise.

## 2022-05-05 NOTE — Telephone Encounter (Signed)
Spoke with the patient. The letter with her biopsy results was received 05/01/22. She does not have any questions regarding her results.  She did inquire about hemorrhoidal banding procedure. She is not presently having any hemorrhoid symptoms. If this were to occur, she will contact me.

## 2023-03-04 DIAGNOSIS — E039 Hypothyroidism, unspecified: Secondary | ICD-10-CM | POA: Diagnosis not present

## 2023-03-12 DIAGNOSIS — E063 Autoimmune thyroiditis: Secondary | ICD-10-CM | POA: Diagnosis not present

## 2023-03-12 DIAGNOSIS — K900 Celiac disease: Secondary | ICD-10-CM | POA: Diagnosis not present

## 2023-03-12 DIAGNOSIS — R413 Other amnesia: Secondary | ICD-10-CM | POA: Diagnosis not present

## 2023-03-12 DIAGNOSIS — E039 Hypothyroidism, unspecified: Secondary | ICD-10-CM | POA: Diagnosis not present

## 2023-03-26 ENCOUNTER — Ambulatory Visit: Payer: Self-pay | Admitting: Cardiology

## 2023-03-30 DIAGNOSIS — L819 Disorder of pigmentation, unspecified: Secondary | ICD-10-CM | POA: Diagnosis not present

## 2023-03-30 DIAGNOSIS — D2272 Melanocytic nevi of left lower limb, including hip: Secondary | ICD-10-CM | POA: Diagnosis not present

## 2023-03-30 DIAGNOSIS — D225 Melanocytic nevi of trunk: Secondary | ICD-10-CM | POA: Diagnosis not present

## 2023-03-30 DIAGNOSIS — D2271 Melanocytic nevi of right lower limb, including hip: Secondary | ICD-10-CM | POA: Diagnosis not present

## 2023-05-13 DIAGNOSIS — E039 Hypothyroidism, unspecified: Secondary | ICD-10-CM | POA: Diagnosis not present

## 2023-06-18 ENCOUNTER — Other Ambulatory Visit: Payer: Self-pay | Admitting: Obstetrics

## 2023-06-18 DIAGNOSIS — Z1331 Encounter for screening for depression: Secondary | ICD-10-CM | POA: Diagnosis not present

## 2023-06-18 DIAGNOSIS — M810 Age-related osteoporosis without current pathological fracture: Secondary | ICD-10-CM

## 2023-06-18 DIAGNOSIS — Z131 Encounter for screening for diabetes mellitus: Secondary | ICD-10-CM | POA: Diagnosis not present

## 2023-06-18 DIAGNOSIS — Z01411 Encounter for gynecological examination (general) (routine) with abnormal findings: Secondary | ICD-10-CM | POA: Diagnosis not present

## 2023-06-18 DIAGNOSIS — N952 Postmenopausal atrophic vaginitis: Secondary | ICD-10-CM | POA: Diagnosis not present

## 2023-06-18 DIAGNOSIS — L900 Lichen sclerosus et atrophicus: Secondary | ICD-10-CM | POA: Diagnosis not present

## 2023-06-18 DIAGNOSIS — Z0000 Encounter for general adult medical examination without abnormal findings: Secondary | ICD-10-CM | POA: Diagnosis not present

## 2023-06-18 DIAGNOSIS — Z01419 Encounter for gynecological examination (general) (routine) without abnormal findings: Secondary | ICD-10-CM | POA: Diagnosis not present

## 2023-06-18 DIAGNOSIS — Z1231 Encounter for screening mammogram for malignant neoplasm of breast: Secondary | ICD-10-CM | POA: Diagnosis not present

## 2023-06-18 DIAGNOSIS — Z13220 Encounter for screening for lipoid disorders: Secondary | ICD-10-CM | POA: Diagnosis not present

## 2023-12-30 ENCOUNTER — Other Ambulatory Visit (HOSPITAL_BASED_OUTPATIENT_CLINIC_OR_DEPARTMENT_OTHER)

## 2024-01-12 ENCOUNTER — Other Ambulatory Visit (HOSPITAL_BASED_OUTPATIENT_CLINIC_OR_DEPARTMENT_OTHER)

## 2024-03-02 DIAGNOSIS — E039 Hypothyroidism, unspecified: Secondary | ICD-10-CM | POA: Diagnosis not present

## 2024-03-06 ENCOUNTER — Ambulatory Visit (HOSPITAL_BASED_OUTPATIENT_CLINIC_OR_DEPARTMENT_OTHER)
Admission: RE | Admit: 2024-03-06 | Discharge: 2024-03-06 | Disposition: A | Source: Ambulatory Visit | Attending: Obstetrics | Admitting: Obstetrics

## 2024-03-06 DIAGNOSIS — Z780 Asymptomatic menopausal state: Secondary | ICD-10-CM | POA: Diagnosis not present

## 2024-03-06 DIAGNOSIS — M810 Age-related osteoporosis without current pathological fracture: Secondary | ICD-10-CM | POA: Diagnosis not present

## 2024-03-09 DIAGNOSIS — R413 Other amnesia: Secondary | ICD-10-CM | POA: Diagnosis not present

## 2024-03-09 DIAGNOSIS — E039 Hypothyroidism, unspecified: Secondary | ICD-10-CM | POA: Diagnosis not present

## 2024-03-09 DIAGNOSIS — E063 Autoimmune thyroiditis: Secondary | ICD-10-CM | POA: Diagnosis not present

## 2024-03-09 DIAGNOSIS — K900 Celiac disease: Secondary | ICD-10-CM | POA: Diagnosis not present

## 2024-03-10 ENCOUNTER — Other Ambulatory Visit

## 2024-03-20 ENCOUNTER — Other Ambulatory Visit (HOSPITAL_BASED_OUTPATIENT_CLINIC_OR_DEPARTMENT_OTHER)

## 2024-03-29 DIAGNOSIS — D0472 Carcinoma in situ of skin of left lower limb, including hip: Secondary | ICD-10-CM | POA: Diagnosis not present

## 2024-03-29 DIAGNOSIS — L814 Other melanin hyperpigmentation: Secondary | ICD-10-CM | POA: Diagnosis not present

## 2024-03-29 DIAGNOSIS — Z85820 Personal history of malignant melanoma of skin: Secondary | ICD-10-CM | POA: Diagnosis not present

## 2024-03-29 DIAGNOSIS — L821 Other seborrheic keratosis: Secondary | ICD-10-CM | POA: Diagnosis not present

## 2024-03-29 DIAGNOSIS — D2262 Melanocytic nevi of left upper limb, including shoulder: Secondary | ICD-10-CM | POA: Diagnosis not present
